# Patient Record
Sex: Male | Born: 1940 | Race: White | Hispanic: No | Marital: Married | Smoking: Never smoker
Health system: Southern US, Community
[De-identification: ages and names within clinical notes are randomized; demographics above are authoritative.]

## PROBLEM LIST (undated history)

## (undated) DIAGNOSIS — R413 Other amnesia: Secondary | ICD-10-CM

## (undated) DIAGNOSIS — I10 Essential (primary) hypertension: Secondary | ICD-10-CM

## (undated) DIAGNOSIS — K1379 Other lesions of oral mucosa: Secondary | ICD-10-CM

## (undated) HISTORY — DX: Other amnesia: R41.3

## (undated) HISTORY — DX: Other lesions of oral mucosa: K13.79

## (undated) HISTORY — PX: APPENDECTOMY: SHX54

---

## 2007-11-13 ENCOUNTER — Other Ambulatory Visit: Payer: Self-pay

## 2007-11-13 ENCOUNTER — Inpatient Hospital Stay: Payer: Self-pay | Admitting: Internal Medicine

## 2009-02-21 ENCOUNTER — Ambulatory Visit: Payer: Self-pay | Admitting: Unknown Physician Specialty

## 2009-11-11 ENCOUNTER — Emergency Department: Payer: Self-pay | Admitting: Emergency Medicine

## 2010-04-21 ENCOUNTER — Observation Stay: Payer: Self-pay | Admitting: Surgery

## 2010-04-26 LAB — PATHOLOGY REPORT

## 2012-01-25 ENCOUNTER — Observation Stay: Payer: Self-pay | Admitting: Internal Medicine

## 2012-01-25 LAB — BASIC METABOLIC PANEL
Anion Gap: 9 (ref 7–16)
BUN: 20 mg/dL — ABNORMAL HIGH (ref 7–18)
Calcium, Total: 8.6 mg/dL (ref 8.5–10.1)
Creatinine: 1.19 mg/dL (ref 0.60–1.30)
EGFR (African American): >60
EGFR (Non-African Amer.): >60
Glucose: 115 mg/dL — ABNORMAL HIGH (ref 65–99)
Osmolality: 287 (ref 275–301)
Potassium: 4.1 mmol/L (ref 3.5–5.1)

## 2012-01-25 LAB — CBC
MCH: 31.3 pg (ref 26.0–34.0)
MCHC: 33.3 g/dL (ref 32.0–36.0)
MCV: 94 fL (ref 80–100)
Platelet: 229 10*3/uL (ref 150–440)
RBC: 4.12 10*6/uL — ABNORMAL LOW (ref 4.40–5.90)
RDW: 13.9 % (ref 11.5–14.5)
WBC: 6 10*3/uL (ref 3.8–10.6)

## 2012-01-25 LAB — TROPONIN I: Troponin-I: <0.02 ng/mL

## 2012-01-25 LAB — MAGNESIUM: Magnesium: 2 mg/dL

## 2012-01-25 LAB — TSH: Thyroid Stimulating Horm: 7.96 u[IU]/mL — ABNORMAL HIGH

## 2012-01-25 LAB — CK TOTAL AND CKMB (NOT AT ARMC): CK-MB: 8.3 ng/mL — ABNORMAL HIGH (ref 0.5–3.6)

## 2012-01-26 LAB — BASIC METABOLIC PANEL
Anion Gap: 9 (ref 7–16)
BUN: 18 mg/dL (ref 7–18)
Calcium, Total: 8.8 mg/dL (ref 8.5–10.1)
Co2: 29 mmol/L (ref 21–32)
Creatinine: 1.1 mg/dL (ref 0.60–1.30)
EGFR (African American): >60
EGFR (Non-African Amer.): >60
Glucose: 124 mg/dL — ABNORMAL HIGH (ref 65–99)
Osmolality: 290 (ref 275–301)
Potassium: 3.8 mmol/L (ref 3.5–5.1)

## 2012-01-26 LAB — CK-MB: CK-MB: 5.1 ng/mL — ABNORMAL HIGH (ref 0.5–3.6)

## 2012-01-26 LAB — TROPONIN I: Troponin-I: <0.02 ng/mL

## 2013-02-03 ENCOUNTER — Ambulatory Visit: Payer: Self-pay | Admitting: Internal Medicine

## 2013-10-17 IMAGING — CR DG CHEST 1V PORT
1 series · 1 of 1 positions shown · non-contrast
Comparison: none

REASON FOR EXAM: BRADYCARDIA
COMMENTS:

PROCEDURE:     DXR - DXR PORTABLE CHEST SINGLE VIEW  - January 25, 2012  [DATE]
RESULT:     Comparison: 11/11/2009

[ap]
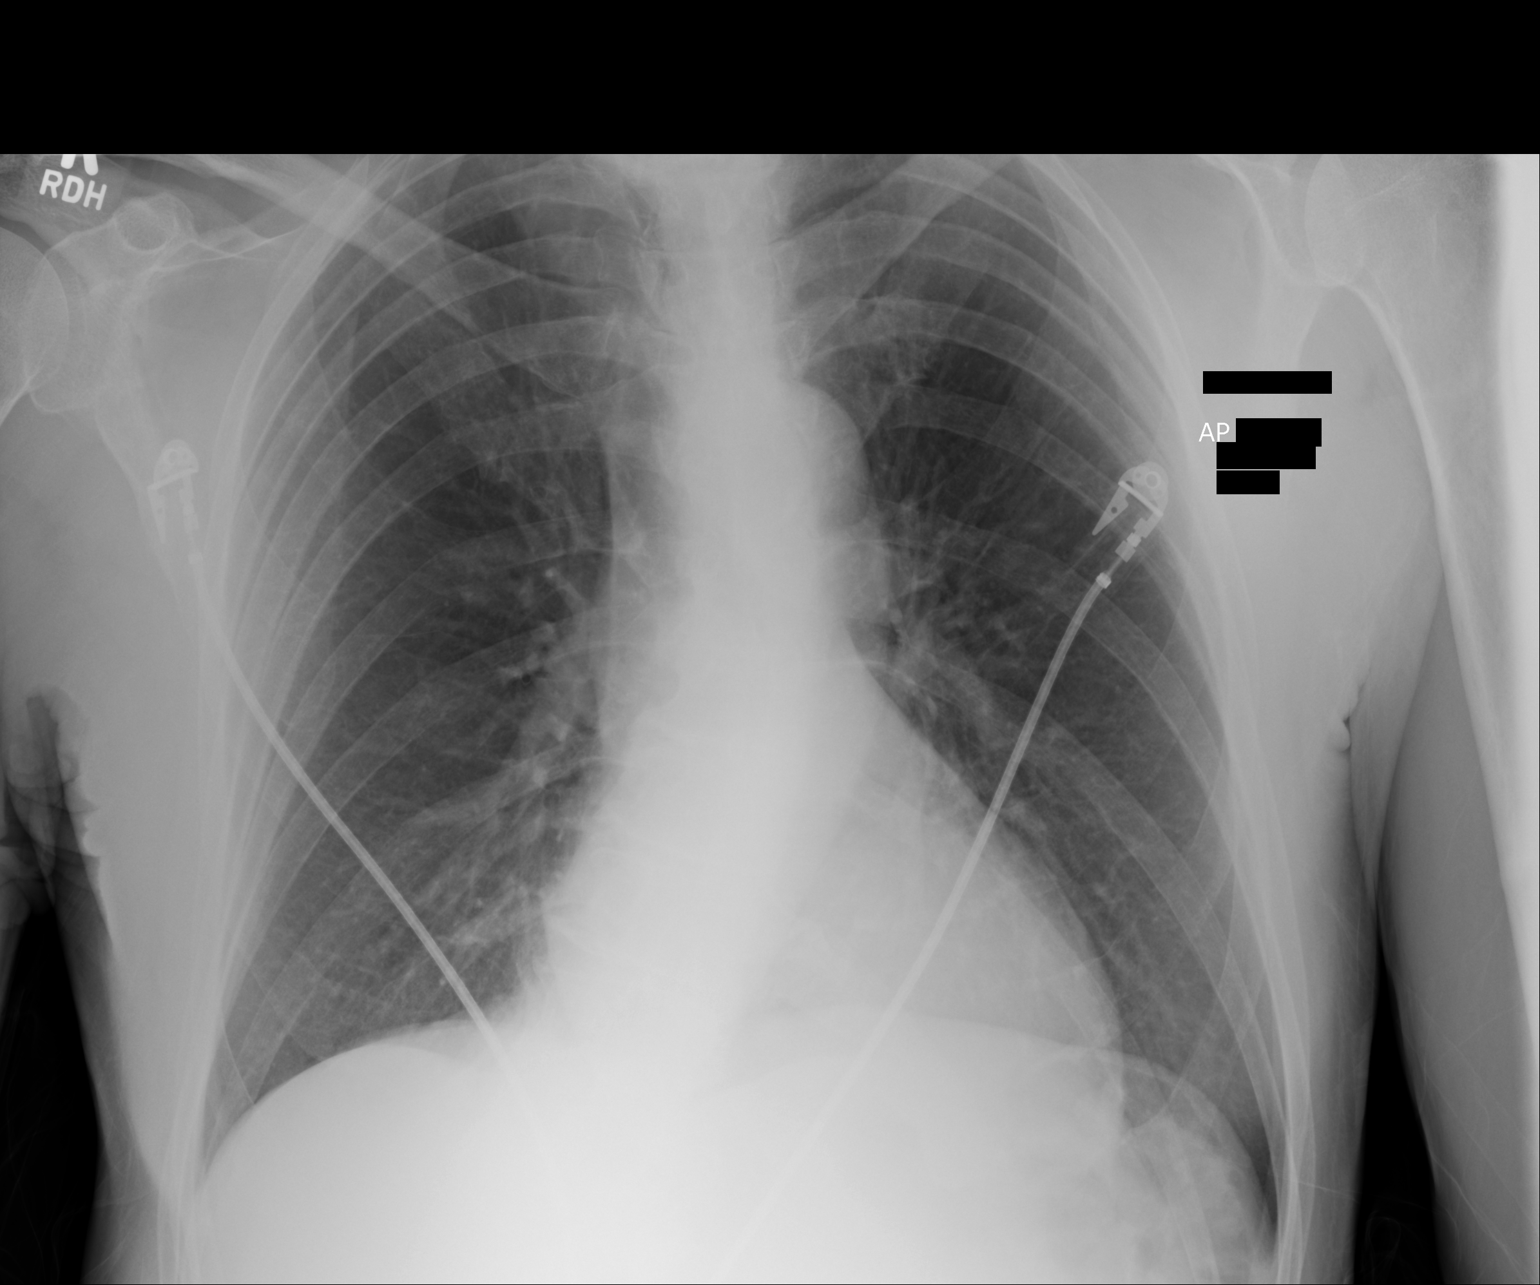

[1 of 1 positions shown; findings below may reference images not displayed]

FINDINGS: Single portable AP chest radiograph is provided.  There is no focal
parenchymal opacity, pleural effusion, or pneumothorax. Normal
cardiomediastinal silhouette. The osseous structures are unremarkable.
IMPRESSION: No acute disease of the che[REDACTED]

## 2014-05-05 ENCOUNTER — Inpatient Hospital Stay: Payer: Self-pay | Admitting: Internal Medicine

## 2014-05-05 LAB — COMPREHENSIVE METABOLIC PANEL
ALBUMIN: 3.7 g/dL (ref 3.4–5.0)
ANION GAP: 6 — AB (ref 7–16)
Alkaline Phosphatase: 56 U/L
BUN: 30 mg/dL — AB (ref 7–18)
Bilirubin,Total: 0.3 mg/dL (ref 0.2–1.0)
CO2: 29 mmol/L (ref 21–32)
CREATININE: 1.66 mg/dL — AB (ref 0.60–1.30)
Calcium, Total: 8.4 mg/dL — ABNORMAL LOW (ref 8.5–10.1)
Chloride: 106 mmol/L (ref 98–107)
EGFR (African American): 53 — ABNORMAL LOW
GFR CALC NON AF AMER: 43 — AB
Glucose: 106 mg/dL — ABNORMAL HIGH (ref 65–99)
Osmolality: 288 (ref 275–301)
Potassium: 4.3 mmol/L (ref 3.5–5.1)
SGOT(AST): 47 U/L — ABNORMAL HIGH (ref 15–37)
SGPT (ALT): 37 U/L
SODIUM: 141 mmol/L (ref 136–145)
Total Protein: 7.4 g/dL (ref 6.4–8.2)

## 2014-05-05 LAB — CBC
HCT: 36.7 % — AB (ref 40.0–52.0)
HGB: 12.2 g/dL — AB (ref 13.0–18.0)
MCH: 31.2 pg (ref 26.0–34.0)
MCHC: 33.2 g/dL (ref 32.0–36.0)
MCV: 94 fL (ref 80–100)
Platelet: 208 10*3/uL (ref 150–440)
RBC: 3.91 10*6/uL — AB (ref 4.40–5.90)
RDW: 14 % (ref 11.5–14.5)
WBC: 6.9 10*3/uL (ref 3.8–10.6)

## 2014-05-05 LAB — DRUG SCREEN, URINE

## 2014-05-05 LAB — ETHANOL: Ethanol: <3 mg/dL

## 2014-05-05 LAB — TROPONIN I

## 2014-05-05 LAB — TSH: Thyroid Stimulating Horm: 7.85 u[IU]/mL — ABNORMAL HIGH

## 2014-05-05 LAB — SALICYLATE LEVEL: SALICYLATES, SERUM: 2.2 mg/dL

## 2014-05-05 LAB — ACETAMINOPHEN LEVEL: Acetaminophen: <2 ug/mL

## 2014-05-05 LAB — T4, FREE: Free Thyroxine: 0.86 ng/dL (ref 0.76–1.46)

## 2014-05-06 LAB — BASIC METABOLIC PANEL
Anion Gap: 4 — ABNORMAL LOW (ref 7–16)
BUN: 26 mg/dL — ABNORMAL HIGH (ref 7–18)
CALCIUM: 8.6 mg/dL (ref 8.5–10.1)
CREATININE: 1.41 mg/dL — AB (ref 0.60–1.30)
Chloride: 107 mmol/L (ref 98–107)
Co2: 29 mmol/L (ref 21–32)
EGFR (African American): >60
GFR CALC NON AF AMER: 53 — AB
GLUCOSE: 90 mg/dL (ref 65–99)
Osmolality: 284 (ref 275–301)
POTASSIUM: 4.3 mmol/L (ref 3.5–5.1)
SODIUM: 140 mmol/L (ref 136–145)

## 2014-07-15 IMAGING — US US RENAL KIDNEY
1 series · 14 of 25 positions shown · non-contrast
Comparison: none

REASON FOR EXAM: elevated creatinine
COMMENTS:

[Series 1: us renal kidney · 0.20mm/px · 14 of 41 slices shown]
[im 1/41]
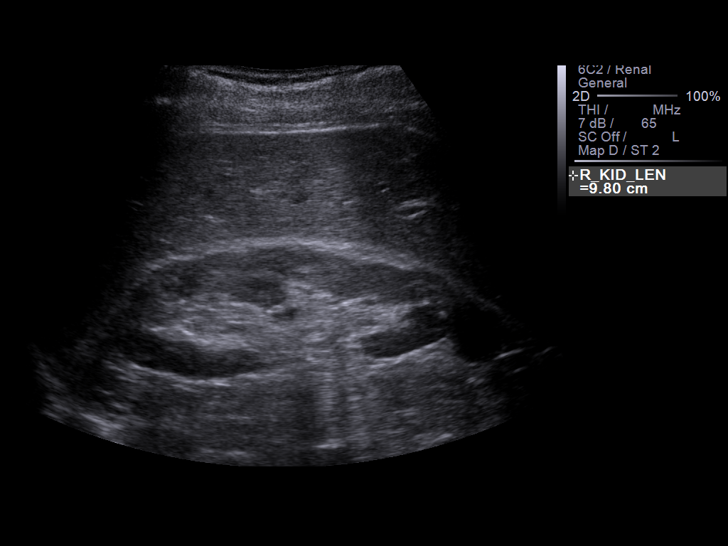
[im 4/41]
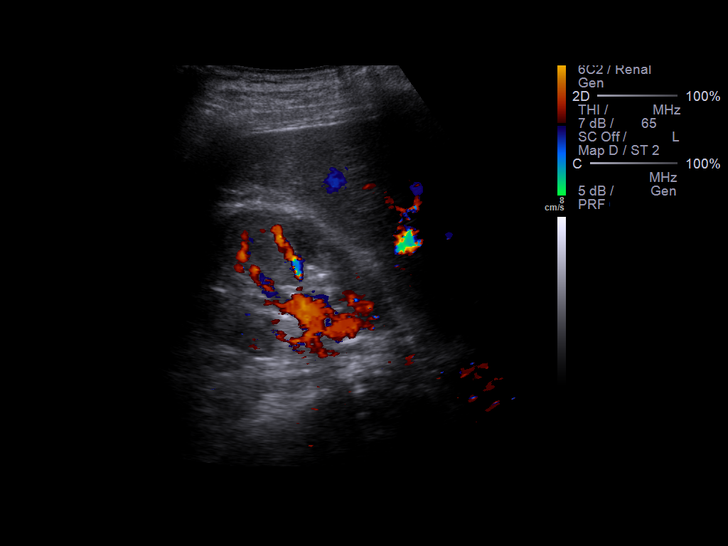
[im 7/41]
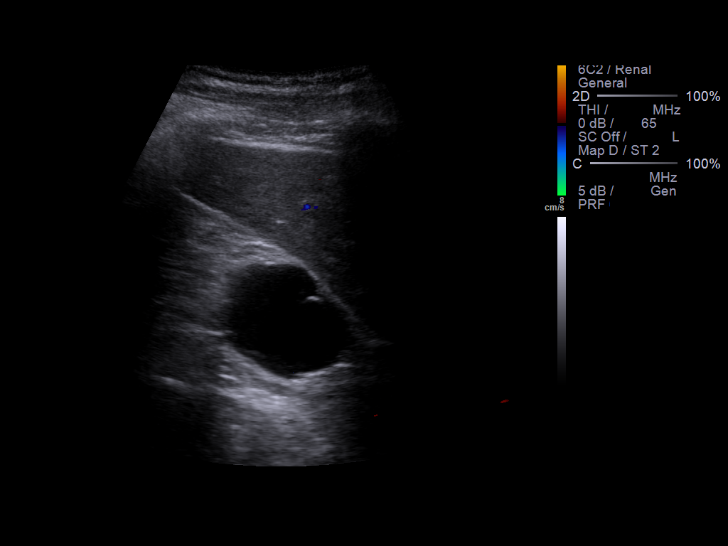
[im 11/41]
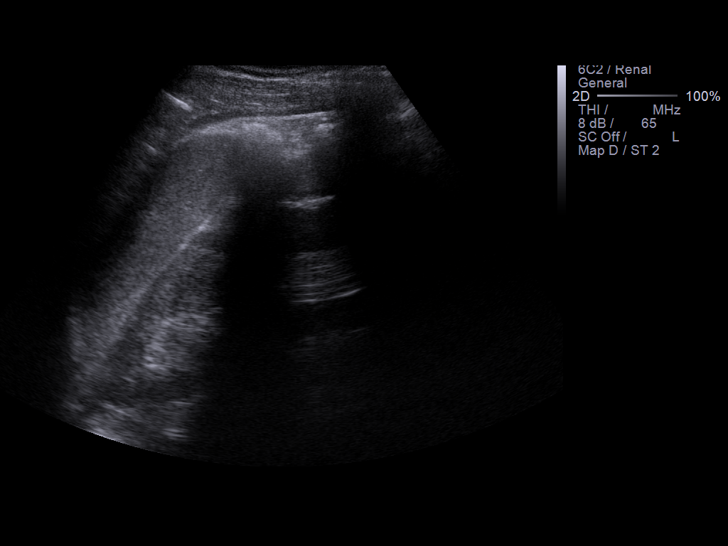
[im 14/41]
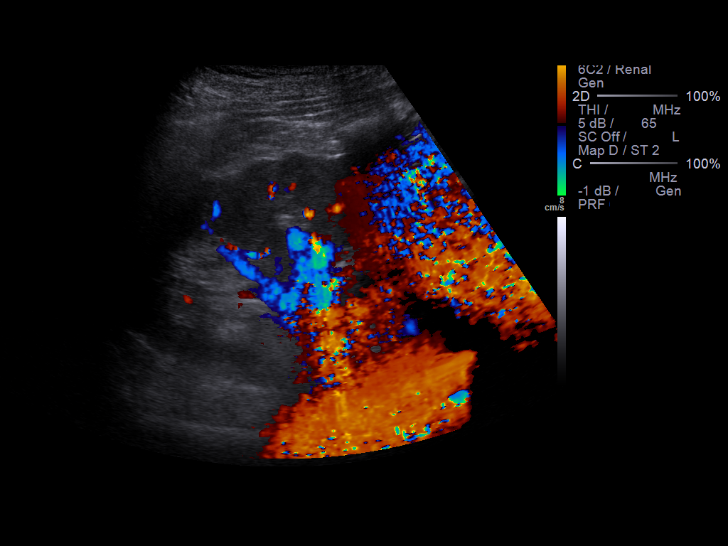
[im 16/41]
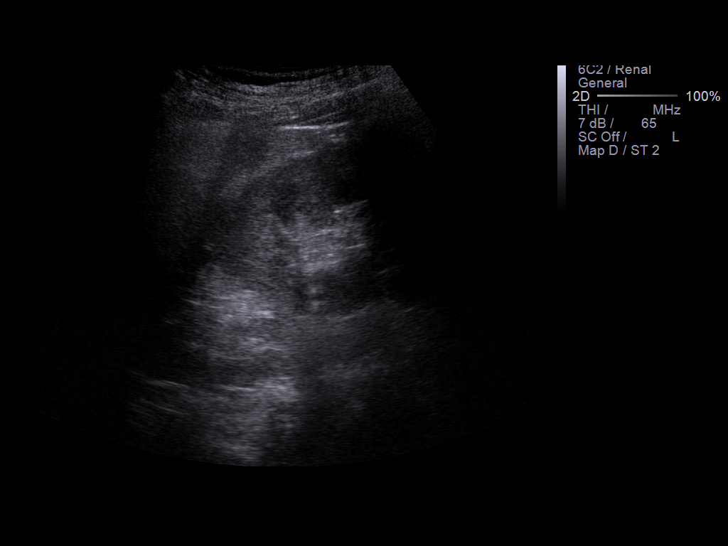
[im 19/41]
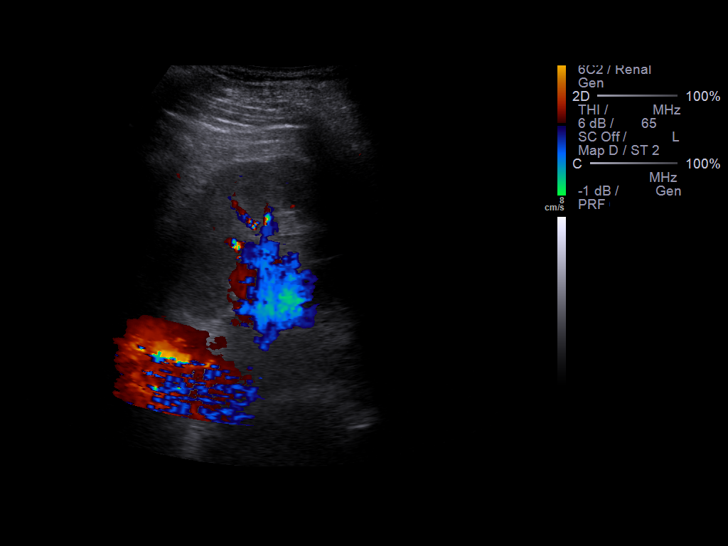
[im 22/41]
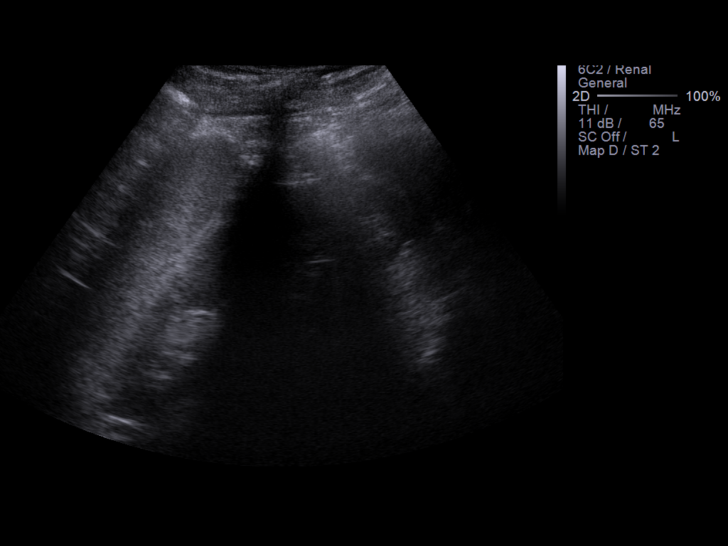
[im 26/41]
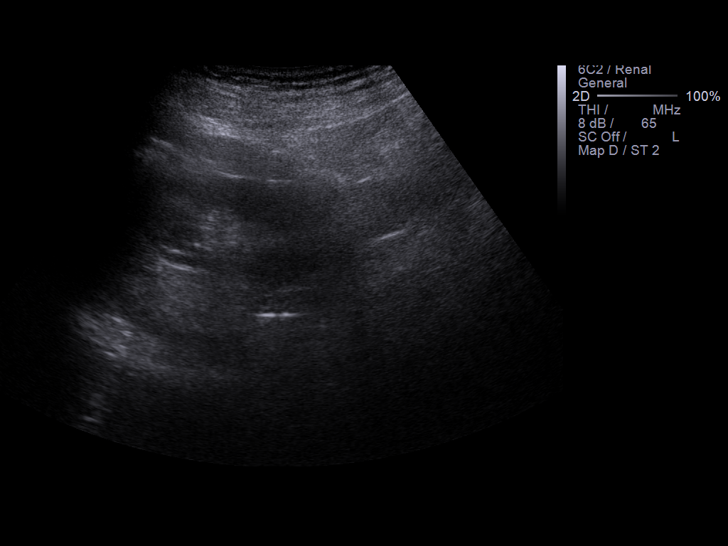
[im 27/41]
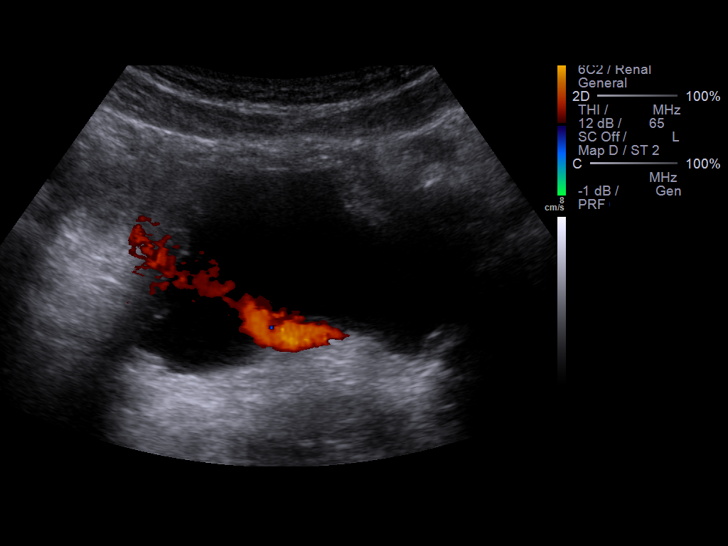
[im 31/41]
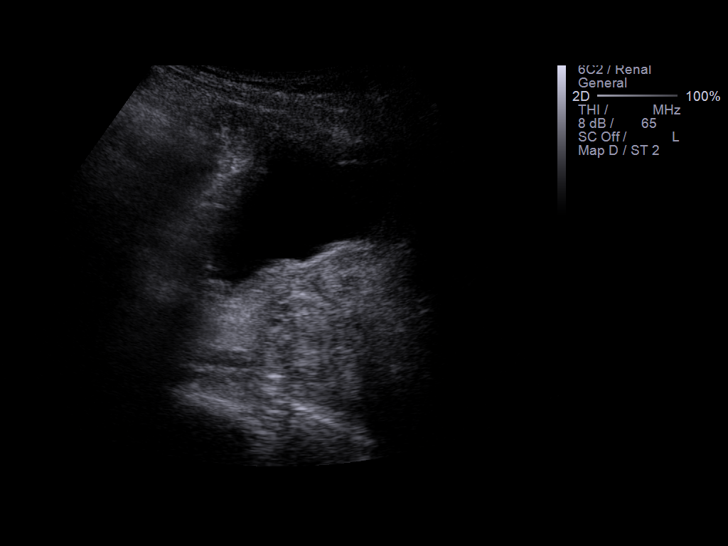
[im 34/41]
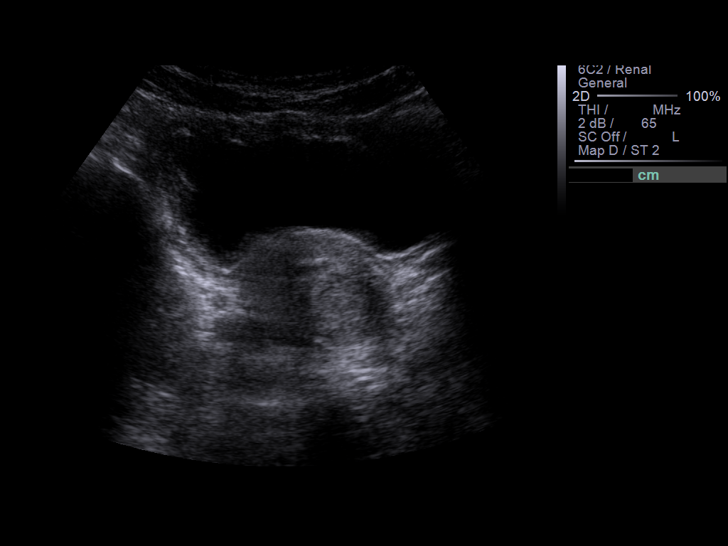
[im 37/41]
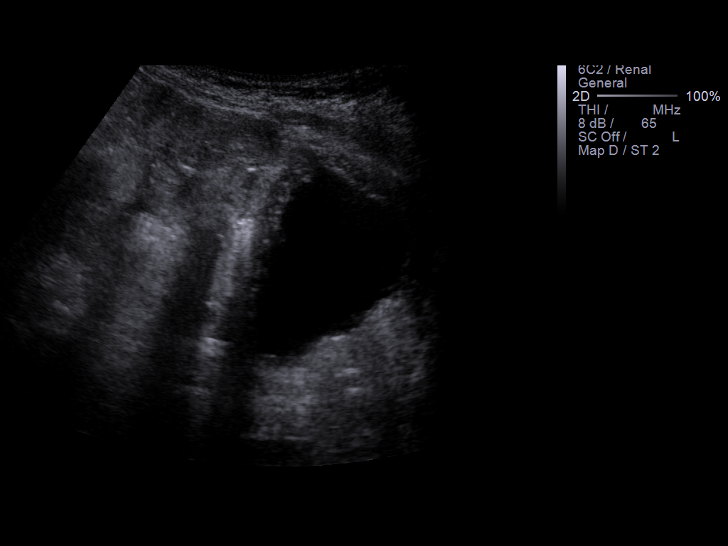
[im 41/41]
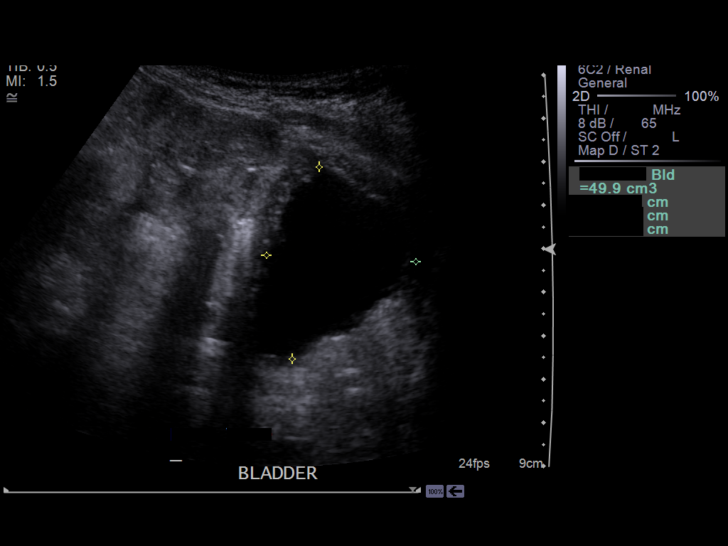

[14 of 25 positions shown; findings below may reference images not displayed]

PROCEDURE:     US  - US KIDNEY  - February 03, 2013 [DATE]

RESULT:     Comparison: None

Technique and findings: Multiple gray-scale and color doppler images of the
kidneys were obtained.

The right kidney measures 9.9 x 4.1 x 3.9 cm and the left kidney measures
11.3 x 5.4 x 4.5 cm. The kidneys are normal in echogenicity. There is no
hydronephrosis.  There are no echogenic foci.  There are arterial
calcification is noted in the left kidney. There are 2 anechoic right renal
mass is with the larger measuring 2.3 x 2.8 x 2.5 cm in the lower pole and
the second anechoic mass in the upper pole measuring 2.8 x 1.2 x 2.2 cm most
consistent with cysts. There is no free fluid in the region of the renal
fossa.

The prevoid bladder volume it is 48.8 cc. The post void bladder volume is
49.9 cc. There is prostatic enlargement.
IMPRESSION: 1. Normal renal ultrasound.

2. No significant change in bladder volume from prevoid to post void volume.

3. Prostatic enlargement. Correlate with PSA.

[REDACTED]

## 2014-08-16 NOTE — H&P (Signed)
PATIENT NAME:  Joel Ryan, Joel Ryan MR#:  [id number] DATE OF BIRTH:  09-Mar-1941  DATE OF ADMISSION:  01/25/2012  PRIMARY CARE PHYSICIAN: Frazier Richards, MD   ER REFERRING PHYSICIAN: Pollie Friar, MD    CHIEF COMPLAINT: Generalized weakness, low heart rate.   HISTORY OF PRESENT ILLNESS: The patient is a 74 year old Caucasian male with history of hypertension, history of apparently bradycardia in the past, who has not been feeling well for the past one week. He was seen by Dr. Ouida Sills the earlier part of the week. At that time he was on Norvasc, which was discontinued, and was changed to Losartan for his blood pressure. He has his blood pressure checked on a daily basis, and over the past few days his wife has noticed that his blood pressure has been low in the 40s. The patient in the ED was noted to have multiple bigeminies and trigeminies on the monitor here. He also was noted to have a CK-MB that was elevated. He had a similar type of elevation noticed during his hospitalization in July 2011. The patient states that he just feels very weak but does not have any fevers, chills. No chest pains. He denies any shortness of breath. No dyspnea on exertion. No orthopnea. He denies any abdominal pain, nausea, vomiting, or diarrhea. He denies any urinary frequency, urgency, or hesitancy.   PAST MEDICAL HISTORY:  1. History of hypertension.  2. History of bradycardia.  3. History of benign prostatic hypertrophy.  ALLERGIES: None.   PAST SURGICAL HISTORY: History of appendectomy. Had a hernia repair as well as right foot surgery.   CURRENT MEDICATIONS: At home he is on: 1. Aspirin 81 mg, 1 tab p.o. daily.  2. Finasteride 5 mg daily.  3. Fish oil 1 tab p.o. b.i.d.  4. Losartan 50 mg daily.  5. Flomax 0.4 mg daily.   SOCIAL HISTORY: History of smoking, quit 30 years ago. History drinking, quit 30 years ago. No drugs.   FAMILY HISTORY: Positive for hypertension. No heart disease.    REVIEW OF  SYSTEMS: CONSTITUTIONAL: Denies any fevers or chills. No weight loss. No weight gain. Complains of generalized fatigue, weakness. EYES: Denies any visual difficulties. No double vision. No cataracts. No glaucoma. HENT: Denies any nasal drainage. No epistaxis. Denies any difficulty with swallowing. EARS: Denies any tinnitus. No hearing difficulties. CARDIOVASCULAR: Denies any chest pains or palpitations. No syncope. PULMONARY: Denies any coughing, wheezing. No hemoptysis. No chronic obstructive pulmonary disease. GASTROINTESTINAL: Denies any nausea, vomiting, or diarrhea. No abdominal pain. No hematemesis. No hematochezia. GENITOURINARY: Denies any frequency, urgency, or hesitancy. ENDOCRINE: Denies any polyuria, nocturia. No diabetes. SKIN: Denies any rash. LYMPHATICS: Denies any lymph node enlargement. VASCULAR: Denies any claudication symptoms. NEUROLOGICAL: Denies any cerebrovascular accident, transient ischemic attack, or seizures. PSYCHIATRIC: Denies any anxiety, depression.   PHYSICAL EXAMINATION:  VITAL SIGNS: Temperature 97.7, pulse 74, respirations 18, blood pressure 186/87.   GENERAL: The patient is a well developed Caucasian male in no acute distress.   HEENT: Head atraumatic, normocephalic. Pupils are equally round, reactive to light and accommodation. There is no conjunctival pallor. No scleral icterus. Nasal exam shows no drainage or ulceration. Oropharynx is clear without any exudates.   NECK: No thyromegaly. No carotid bruits.   CARDIOVASCULAR: Regular rate and rhythm. No murmurs, rubs, clicks, or gallops. PMI is not displaced.   LUNGS: No accessory muscle usage. Clear to auscultation bilaterally without any rales, rhonchi, or wheezing.   ABDOMEN: Soft, nontender, nondistended. Positive bowel sounds x4.  EXTREMITIES: No clubbing, cyanosis, or edema.   SKIN: No rash.   LYMPHATICS: No lymph nodes palpable.   VASCULAR: Good DP, PT pulses.   PSYCHIATRIC: Not anxious or  depressed.   NEUROLOGICAL: Awake, alert, oriented x3. No focal deficits.   LABORATORY, DIAGNOSTIC AND RADIOLOGICAL DATA: In the ED: EKG shows sinus rhythm with PACs. Glucose 115, BUN 20, creatinine 1.19, sodium 142, potassium 4.1, chloride 103, CO2 30, calcium 8.6. His troponin was less than 0.02. CK-MB is 8.3. WBC 6.0, hemoglobin 12.9, platelet count is 229.    ASSESSMENT AND PLAN: The patient is a 74 year old who presents with complaint of generalized weakness, noted to have low heart rate at home, here having bigeminies, trigeminies, PACs.   1. Generalized weakness: Unclear cause, possibly related to his bigeminies  and trigeminies. We will check a TSH, B12 and vitamin D level.  2. Bradycardia: Possibly due to his blood pressure machine reading the bigeminy and trigeminy in error, has no heartbeat. Also, he has elevated CK-MB which has been chronic. He may need an event monitor for home. We will ask Cardiology to see.  3. Hypertension: Recently started on losartan, which we will continue due to his blood pressure being high on presentation. We will do p.r.n. hydralazine. His losartan dose may need to be adjusted.  4. Miscellaneous:  The patient is ambulatory. Does not need deep vein thrombosis prophylaxis.   TIME SPENT:   35 minutes. ____________________________ Lafonda Mosses Posey Pronto, MD shp:cbb D: 01/25/2012 20:26:26 ET T: 01/26/2012 10:32:46 ET JOB#: [id number] cc: Ocie Cornfield. Ouida Sills, MD Alric Seton MD ELECTRONICALLY SIGNED 01/26/2012 20:46

## 2014-08-28 NOTE — H&P (Signed)
PATIENT NAME:  Joel Ryan, Joel Ryan MR#:  [id number] DATE OF BIRTH:  1940-09-13  DATE OF ADMISSION:  05/05/2014  PRIMARY CARE PHYSICIAN: Ocie Cornfield. Ouida Sills, MD  REFERRING PHYSICIAN: Brunilda Payor A. Edd Fabian, MD  CHIEF COMPLAINT: Accidental overdose with Aricept.  HISTORY OF PRESENT ILLNESS: Joel Ryan is a 74 year old pleasant male with history of dementia, had an accidental overdose with Aricept. The patient ate his dinner. The patient's wife gave him his Aricept and told him to take 1 pill; however, the patient took all the pills. The patient's wife does not remember how many pills were there. However, it was refilled around this time but the patient had carried over pills from the previous month. Considering this, the patient is brought to the Emergency Department. Workup in the Emergency Department is unremarkable. The patient is not tachycardic, not anxious. . No chest pain, palpitations. No increased salivation. We called Poison Control who recommended patient to be watched for arrhythmias. Also, stated that the half-life of the Aricept is about 48 hours.   PAST MEDICAL HISTORY:  1.  Dementia.  2.  Hypertension. 3.  Benign prostatic hypertrophy.   PAST SURGICAL HISTORY: 1.  Appendectomy. 2.  Hernia repair. 3.  Right foot surgery.  ALLERGIES: No known drug allergies.  HOME MEDICATIONS: 1.  Tamsulosin 0.4 mg once a day. 2.  Losartan 15 mg once a day. 3.  Fish oil 1000 mg once a day. 4.  Finasteride 5 mg once a day. 5.  Donepezil 10 mg once a day. 6.  Aspirin 81 mg daily.  SOCIAL HISTORY: No history of smoking, drinking, alcohol, or using illicit drugs. Retired, married lives with his wife.   FAMILY HISTORY: Positive for hypertension.  REVIEW OF SYSTEMS: Could not be obtained, as patient has dementia. He is able to communicate but answers many of the questions that he does not remember.  PHYSICAL EXAMINATION: GENERAL: He is a well-built, well-nourished, age-appropriate male lying down in  the bed not in distress. VITAL SIGNS: Temperature 97.2, pulse 62, blood pressure 178/104, respiratory rate 18, oxygen saturation 97% on room air. HEENT: Head normocephalic, atraumatic. There is no scleral icterus. Conjunctivae normal. Pupils equal and react to light. Mucous membranes moist. No pharyngeal erythema.  NECK: Supple. No lymphadenopathy. No JVD. No carotid bruit. CHEST: No focal tenderness. LUNGS: Bilaterally clear to auscultation. HEART: S1, S2 regular. No murmurs are heard.  ABDOMEN: Bowel sounds positive. Nontender, nondistended. No hepatosplenomegaly.  EXTREMITIES: No pedal edema. Pulses 2+. NEUROLOGIC: Patient is alert, oriented to place, person, but not to time. No apparent cranial nerve abnormalities. Motor 5/5 in upper and lower extremities.  SKIN: No rash or lesions. MUSCULOSKELETAL: Good range of motion in all extremities.  LABORATORY DATA: CMP: BUN 33, creatinine of 1.66, rest of the values are within normal limits. CBC: WBC of 6.9, hemoglobin 12.2,. Urinalysis negative for nitrites and leukocyte esterase. Urine drug screen is negative.  ASSESSMENT AND PLAN:  1.  Accidental overdose with Aricept secondary to advanced dementia: Admit the patient to the monitored bed. Continue with intravenous fluids. Watch for signs of cholinergic side effects. 2.  History of hypertension: Continue with losartan. 3.  Benign prostatic hypertrophy: Continue with tamsulosin, finasteride.  TIME SPENT: 50 minutes.   ____________________________ Monica Becton, MD pv:bm D: 05/06/2014 00:18:27 ET T: 05/06/2014 00:46:02 ET JOB#: [id number]  cc: Monica Becton, MD, <Dictator> Ocie Cornfield. Ouida Sills, MD Grier Mitts Suki Crockett MD ELECTRONICALLY SIGNED 05/07/2014 23:46

## 2017-08-07 ENCOUNTER — Other Ambulatory Visit: Payer: Self-pay | Admitting: Unknown Physician Specialty

## 2017-08-07 DIAGNOSIS — K1370 Unspecified lesions of oral mucosa: Secondary | ICD-10-CM

## 2017-08-14 ENCOUNTER — Ambulatory Visit
Admission: RE | Admit: 2017-08-14 | Discharge: 2017-08-14 | Disposition: A | Discharge status: Home / Self Care | Payer: Medicare Other | Source: Ambulatory Visit | Attending: Unknown Physician Specialty | Admitting: Unknown Physician Specialty

## 2017-08-14 DIAGNOSIS — I700 Atherosclerosis of aorta: Secondary | ICD-10-CM | POA: Insufficient documentation

## 2017-08-14 DIAGNOSIS — K1370 Unspecified lesions of oral mucosa: Secondary | ICD-10-CM | POA: Diagnosis present

## 2017-08-14 DIAGNOSIS — I6523 Occlusion and stenosis of bilateral carotid arteries: Secondary | ICD-10-CM | POA: Diagnosis not present

## 2017-08-14 HISTORY — DX: Essential (primary) hypertension: I10

## 2017-08-14 LAB — POCT I-STAT CREATININE: Creatinine, Ser: 1.7 mg/dL — ABNORMAL HIGH (ref 0.61–1.24)

## 2017-08-14 MED ORDER — IOHEXOL 300 MG/ML  SOLN: 75.0000 mL | Freq: Once | INTRAMUSCULAR | Status: DC | PRN

## 2017-08-14 MED ORDER — IOHEXOL 300 MG/ML  SOLN
55.0000 mL | Freq: Once | INTRAMUSCULAR | Status: AC | PRN
  Administered 2017-08-14: 55 mL via INTRAVENOUS

## 2017-08-19 ENCOUNTER — Other Ambulatory Visit: Payer: Self-pay | Admitting: Pathology

## 2017-08-25 ENCOUNTER — Inpatient Hospital Stay: Payer: Medicare Other | Attending: Oncology | Admitting: Oncology

## 2017-08-25 ENCOUNTER — Encounter: Payer: Self-pay | Admitting: Oncology

## 2017-08-25 ENCOUNTER — Telehealth: Payer: Self-pay | Admitting: *Deleted

## 2017-08-25 ENCOUNTER — Other Ambulatory Visit: Payer: Self-pay | Admitting: Oncology

## 2017-08-25 ENCOUNTER — Ambulatory Visit: Payer: Medicare Other

## 2017-08-25 VITALS — BP 137/83 | HR 70 | Temp 97.7°F | Resp 18 | Ht 70.0 in | Wt 144.0 lb

## 2017-08-25 DIAGNOSIS — M899 Disorder of bone, unspecified: Secondary | ICD-10-CM | POA: Diagnosis not present

## 2017-08-25 DIAGNOSIS — C801 Malignant (primary) neoplasm, unspecified: Secondary | ICD-10-CM

## 2017-08-25 DIAGNOSIS — C7951 Secondary malignant neoplasm of bone: Secondary | ICD-10-CM | POA: Diagnosis not present

## 2017-08-25 DIAGNOSIS — I10 Essential (primary) hypertension: Secondary | ICD-10-CM | POA: Diagnosis not present

## 2017-08-25 DIAGNOSIS — Z7189 Other specified counseling: Secondary | ICD-10-CM

## 2017-08-25 DIAGNOSIS — F0390 Unspecified dementia without behavioral disturbance: Secondary | ICD-10-CM | POA: Diagnosis not present

## 2017-08-25 NOTE — Progress Notes (Addendum)
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 Con Memos  Date of visit: 08/25/17   History of presenting illness- Patient is a 77 yr old male with advanced dementia. He lives with his wife and is independent of his ADL's. He is oriented to self but not to place and person. He is constantly asking to go to an eating competition. He went to a dentist few weeks wago and was noted to have a soft tissue swelling the retromolar trigone area. He was Dr. Con Memos from ENT who did a biopsy of this area which showed non specific inflammation but no malignancy. He was also noted to have swelling over his forehead and scalp as well as inner aspect of his right eye which has come up the last 1 month. He denies any pain at this time at that site. Reports pain in his b/l shoulders and knees.his appetite is fair and his family does not report any weight loss.  He is here with [name] and [name].   CT soft tissue neck showed: Marland Kitchen Extensive homogeneous osseous lesions with diffuse lytic changes and reactive cortical thickening. Findings are compatible with diffuse metastatic disease. No primary source is identified. Several of these lesions would be amenable to biopsy. 2. Single soft tissue lesion adjacent to the mandible anteriorly on the left. 3.  Aortic Atherosclerosis (ICD10-I70.0). 4. 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 5. Advanced degenerative changes of the cervical spine with sparing of the cervical and upper thoracic spine from the lytic process. 6. Lateral displacement of the right globe secondary to a mass lesion centered in the anterior right  ethmoid. There is no invasion of the orbital cone.    ECOG PS- 2  Pain scale- 0   Review of systems- limited due to dementia   Review of Systems  Constitutional: Negative for chills, fever, malaise/fatigue and weight loss.  HENT: Negative for congestion, ear discharge and nosebleeds.   Eyes: Negative for blurred vision.  Respiratory: Negative for cough, hemoptysis, sputum production, shortness of breath and wheezing.   Cardiovascular: Negative for chest pain, palpitations, orthopnea and claudication.  Gastrointestinal: Negative for abdominal pain, blood in stool, constipation, diarrhea, heartburn, melena, nausea and vomiting.  Genitourinary: Negative for dysuria, flank pain, frequency, hematuria and urgency.  Musculoskeletal: Negative for back pain, joint pain and myalgias.  Skin: Negative for rash.  Neurological: Negative for dizziness, tingling, focal weakness, seizures, weakness and headaches.  Endo/Heme/Allergies: Does not bruise/bleed easily.  Psychiatric/Behavioral: Negative for depression and suicidal ideas. The patient does not have insomnia.     Allergies  Allergen Reactions  . Beta Adrenergic Blockers     Other reaction(s): Other (See Comments), Other (See Comments) Bradycardia Bradycardia     There are no active problems to display for this patient.    Past Medical History:  Diagnosis Date  . Hypertension   . Memory deficit   . Oral mass      Past Surgical History:  Procedure Laterality Date  . APPENDECTOMY      Social History   Socioeconomic History  . Marital status: Married    Spouse name: Not on file  . Number of children: Not on  file  . Years of education: Not on file  . Highest education level: Not on file  Occupational History  . Not on file  Social Needs  . Financial resource strain: Not on file  . Food insecurity:    Worry: Not on file    Inability: Not on file  . Transportation needs:    Medical: Not on file    Non-medical:  Not on file  Tobacco Use  . Smoking status: Never Smoker  . Smokeless tobacco: Never Used  Substance and Sexual Activity  . Alcohol use: Not Currently  . Drug use: Not on file  . Sexual activity: Not on file  Lifestyle  . Physical activity:    Days per week: Not on file    Minutes per session: Not on file  . Stress: Not on file  Relationships  . Social connections:    Talks on phone: Not on file    Gets together: Not on file    Attends religious service: Not on file    Active member of club or organization: Not on file    Attends meetings of clubs or organizations: Not on file    Relationship status: Not on file  . Intimate partner violence:    Fear of current or ex partner: Not on file    Emotionally abused: Not on file    Physically abused: Not on file    Forced sexual activity: Not on file  Other Topics Concern  . Not on file  Social History Narrative  . Not on file     Family History  Problem Relation Age of Onset  . Diabetes Father   . Prostate cancer Father   . Diabetes Brother   . Stroke Brother   . Diabetes Maternal Grandmother      Current Outpatient Medications:  .  donepezil (ARICEPT) 10 MG tablet, Take 10 mg by mouth at bedtime., Disp: , Rfl:  .  finasteride (PROSCAR) 5 MG tablet, Take 5 mg by mouth daily., Disp: , Rfl:  .  losartan (COZAAR) 25 MG tablet, Take 25 mg by mouth daily., Disp: , Rfl:  .  memantine (NAMENDA) 10 MG tablet, Take 10 mg by mouth., Disp: , Rfl:    Physical exam:  Vitals:   08/25/17 1110  BP: 137/83  Pulse: 70  Resp: 18  Temp: 97.7 F (36.5 C)  TempSrc: Oral  Weight: 143 lb 15.4 oz (65.3 kg)  Height: 5\' 10"  (1.778 m)   Physical Exam  Constitutional:  He is thin and frail does not appear to be in any acute distress. He walks with a mild stooping, slow gait  HENT:  Head: Normocephalic and atraumatic.  Ill defined mass noted over inner aspect of right eye as well as forehead and scalp. There is a soft tissue swelling  noted over retromolar trigone on the left side  Eyes: Pupils are equal, round, and reactive to light. EOM are normal.  Neck: Normal range of motion.  Cardiovascular: Normal rate, regular rhythm and normal heart sounds.  Pulmonary/Chest: Effort normal and breath sounds normal.  Abdominal: Soft. Bowel sounds are normal.  Lymphadenopathy:  No palpable cervical, supraclavicular, axillary or inguinal adenopathy  Neurological: He is alert.  Oriented to self only  Skin: Skin is warm and dry.       CMP Latest Ref Rng & Units 08/14/2017  Glucose 65 - 99 mg/dL -  BUN 7 - 18 mg/dL -  Creatinine 0.61 - 1.24 mg/dL 1.70(H)  Sodium  136 - 145 mmol/L -  Potassium 3.5 - 5.1 mmol/L -  Chloride 98 - 107 mmol/L -  CO2 21 - 32 mmol/L -  Calcium 8.5 - 10.1 mg/dL -  Total Protein 6.4 - 8.2 g/dL -  Total Bilirubin 0.2 - 1.0 mg/dL -  Alkaline Phos Unit/L -  AST 15 - 37 Unit/L -  ALT U/L -   CBC Latest Ref Rng & Units 05/05/2014  WBC 3.8 - 10.6 x10 3/mm 3 6.9  Hemoglobin 13.0 - 18.0 g/dL 12.2(L)  Hematocrit 40.0 - 52.0 % 36.7(L)  Platelets 150 - 440 x10 3/mm 3 208    No images are attached to the encounter.  Ct Soft Tissue Neck W Contrast  Result Date: 08/15/2017 CLINICAL DATA:  Lesion of the oral mucosa. Additional mass lesion identified in the nose and on the forehead. EXAM: CT NECK WITH CONTRAST TECHNIQUE: Multidetector CT imaging of the neck was performed using the standard protocol following the bolus administration of intravenous contrast. CONTRAST:  68mL OMNIPAQUE IOHEXOL 300 MG/ML  SOLN COMPARISON:  None. FINDINGS: Pharynx and larynx: No focal mucosal or submucosal lesions are present. The nasopharynx is clear. Tongue base is within normal. Epiglottis is within normal limits. Vocal cords are midline and symmetric. The trachea is unremarkable. Salivary glands: Submandibular and parotid glands are normal bilaterally. Thyroid: 8 mm nodule is present right lobe of the thyroid. Other smaller nodules  are present as well. No dominant nodule is present. Lymph nodes: No significant cervical or upper mediastinal adenopathy is present. Vascular: Atherosclerotic calcifications are present at the carotid bifurcations bilaterally. There is a high-grade near occlusive stenosis of the right internal carotid artery with dense calcifications. Atherosclerotic calcifications are present at the left carotid artery origin without a significant stenosis relative to the more distal vessel. Enhancement is present in the vertebral arteries bilaterally without focal stenosis. Limited intracranial: Within normal limits. Visualized orbits: The right globe is displaced laterally secondary to a nasal mass which encroaches into the region of medial canthus. There is no retro-orbital or intraconal invasion. Mastoids and visualized paranasal sinuses: A 2.6 x 3.8 homogeneous mass lesion is centered in the right ethmoid air cells anteriorly. This displaces the right globe laterally and extends anteriorly from the nose. A 7 mm osseous lesion is present in the anterior right maxillary sinus. Additional lytic lesion is present posteriorly in the maxilla on the right measuring 10 mm. Extensive circumferential mucosal thickening is present in the left maxillary sinus without an obstructing lesion. The paranasal sinuses are otherwise clear. Skeleton: Widespread osseous lesions are compatible with diffuse metastatic disease. A prominent lesion in the anterior left paramedian maxilla demonstrates significant bone destruction and measures 2.2 x 0.8 cm. Just lateral to this is a more subcutaneous soft tissue lesion of similar density which measures 2.2 x 1.8 x 1.0 cm. Additional destructive lesion is present at the angle of mandible on the right. The lesion is present in the posterior left mandible measuring 1 4 x 2.4 x 1.6 cm. Lytic lesions are present in the humeral heads bilaterally. There is partial collapse of the left humeral head. An additional  lesion is present proximal left humerus measuring at least 4.5 cm, but incompletely imaged. Lesions are present in the medial clavicles bilaterally with extensive destruction and extraosseous extension on the left. A lesion in the anterior aspect of the sternum measures 1.5 cm. A large lesion in the posterior right eighth rib is incompletely imaged, but measures at least 6.0 x 3.5 cm  on axial imaging. Advanced degenerative changes are present in the cervical spine. No focal lytic lesions are present in the spine. Upper chest: Atherosclerotic changes are present at the aortic arch and origins the great vessels without focal stenosis or aneurysm. No significant mediastinal adenopathy is present. Osseous lesions are as above. The upper lung fields are clear. There is some atelectasis around the posterior right eighth rib lesion. Other: Other than the soft tissue lesion anterior and lateral to the mandible on the left, no other soft tissue lesions are present. The lesions appear to be related to bone. IMPRESSION: 1. Extensive homogeneous osseous lesions with diffuse lytic changes and reactive cortical thickening. Findings are compatible with diffuse metastatic disease. No primary source is identified. Several of these lesions would be amenable to biopsy. 2. Single soft tissue lesion adjacent to the mandible anteriorly on the left. 3.  Aortic Atherosclerosis (ICD10-I70.0). 4. 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 5. Advanced degenerative changes of the cervical spine with sparing of the cervical and upper thoracic spine from the lytic process. 6. Lateral displacement of the right globe secondary to a mass lesion centered in the anterior right ethmoid. There is no invasion of the orbital cone. Electronically Signed   By: San Morelle M.D.   On: 08/15/2017 07:35    Assessment and plan- Patient is a 77 y.o. 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 However patient noted to have ethmoid mass and diffuse bone mets highly concerning for malignancy.this has appeared relatively quickly per family in the last 1 month. Patient is unable to make any decisions for himself. Patients wife Joel Ryan is his health care proxy. Both Thersa Salt and her son Roxie would like to keep the patient comfortable and do not wish to put him through more tests and biopsies or further treatments. They understand that ideally he needs a PET/CT followed by biopsy of the palpable sites. Underlying etiology is likely malignancy which may be treatable but not curable given that he has bone metastases. They do not wish to pursue any active treatment for this and would like to focus on his QOL and comfort only.  I will therefore initiate hospice referral at this time which they are agreeable. I have asked [place]ia to drop off the HCP form that she has with her. With Stage IV untreated malignancy, his prognosis is likely <6 months. They will call us if they have any questions or concerns   Thank you for this kind referral and the opportunity to participate in the care of this patient   Visit Diagnosis 1. Bone metastases ([place])   2. Goals of care, counseling/discussion     Dr. Randa Evens, MD, MPH [facility] [phone] 08/25/2017  1:46 PM

## 2017-08-25 NOTE — Progress Notes (Signed)
Pt has dementia issues-can answer questions but today he repeatedly asked his son if he wanted to have eating contest while I was checking pt. In.  The son and wife said that his dementia meds have been maxed to the dose allowed.

## 2017-08-25 NOTE — Telephone Encounter (Signed)
Hospice paper for initiation of services faxed to hospice today. Sent electronic fax to [employer] of the progress notes from [facility]

## 2017-08-28 ENCOUNTER — Other Ambulatory Visit: Payer: Self-pay | Admitting: *Deleted

## 2017-08-28 MED ORDER — MORPHINE SULFATE (CONCENTRATE) 20 MG/ML PO SOLN: ORAL | 0 refills | Status: AC

## 2017-08-28 NOTE — Addendum Note (Signed)
Addended by: Betti Cruz on: 08/28/2017 02:03 PM   Modules accepted: Orders

## 2017-08-28 NOTE — Telephone Encounter (Signed)
Yes ok to start prn morphine solution 5 mg Q4 for pain and increase as per symptoms

## 2017-08-28 NOTE — Telephone Encounter (Signed)
Hospice opened patient to services today and are asking for pain medicine for this patient. States he can't even raise his arms due to pain in his shoulders and back. He currently has no pain medications. Please advise

## 2017-08-29 ENCOUNTER — Other Ambulatory Visit: Payer: Self-pay | Admitting: *Deleted

## 2017-08-29 MED ORDER — MORPHINE SULFATE ER 15 MG PO TBCR: 15.0000 mg | EXTENDED_RELEASE_TABLET | Freq: Two times a day (BID) | ORAL | 0 refills | Status: AC

## 2017-08-29 MED ORDER — ALPRAZOLAM 0.5 MG PO TABS: 0.5000 mg | ORAL_TABLET | ORAL | 0 refills | Status: AC | PRN

## 2017-08-29 NOTE — Telephone Encounter (Signed)
Morphine 15 mg SR BID 60 tab. No refills

## 2017-08-29 NOTE — Telephone Encounter (Signed)
ok 

## 2017-08-29 NOTE — Telephone Encounter (Signed)
Hospice nurse called and sates that patient pain is not controlled and is asking for a long acting pain medicine for him. She is also requesting Lorazepam 0.5 mg every 4 hours as needed prescription be sent for aggitation. Please advise

## 2017-08-29 NOTE — Telephone Encounter (Signed)
What long acting medicine do you want to order?

## 2017-09-02 ENCOUNTER — Other Ambulatory Visit: Payer: Self-pay | Admitting: *Deleted

## 2017-09-02 ENCOUNTER — Telehealth: Payer: Self-pay | Admitting: *Deleted

## 2017-09-02 NOTE — Telephone Encounter (Signed)
Colletta Maryland called and reports that patient is actively dying and that the son who is aa physician took it upon himself to give more MS than ordered. He is ordered 0.25 ml every 4 hours and he was giving 1 ml. He is afraid that he is going to run out of medicine and I discussed with Colletta Maryland that this bottle was just prescribed 5/3 with directions for 0.25 ml every 4 hours as needed. She will go out and check on patient tomorrow and call me back if it is needed. She states she had a discussion with the son regarding this

## 2017-09-02 NOTE — Telephone Encounter (Signed)
Prescription for MS ER was only half filled due to patient being hospice and they only cover 15 day supply

## 2017-09-11 ENCOUNTER — Encounter: Payer: Self-pay | Admitting: Unknown Physician Specialty

## 2017-09-27 DEATH — deceased
